# Patient Record
Sex: Female | Born: 1965 | Race: White | Hispanic: No | Marital: Married | Smoking: Never smoker
Health system: Southern US, Community
[De-identification: ages and names within clinical notes are randomized; demographics above are authoritative.]

---

## 2004-12-03 ENCOUNTER — Ambulatory Visit: Payer: Self-pay | Admitting: Otolaryngology

## 2005-07-12 ENCOUNTER — Ambulatory Visit: Payer: Self-pay | Admitting: Specialist

## 2005-08-06 ENCOUNTER — Ambulatory Visit: Payer: Self-pay | Admitting: Specialist

## 2008-04-02 ENCOUNTER — Ambulatory Visit: Payer: Self-pay | Admitting: Family Medicine

## 2010-03-11 ENCOUNTER — Ambulatory Visit: Payer: Self-pay | Admitting: Family Medicine

## 2012-07-04 IMAGING — CT CT STONE STUDY
1 of 2 series · 15 of 32 positions shown, 19 images · non-contrast
Comparison: none

REASON FOR EXAM: RT flank pain  hx of nephroliathasis  incomplete voiding
 hydronephrosis
COMMENTS:

PROCEDURE:     ISSAKA - ISSAKA ABDOMEN/PELVIS WO ( STONE)  - March 11, 2010  [DATE]
RESULT:     Comparison: 04/02/2008
TECHNIQUE: Multiple axial images from the lung bases to the symphysis pubis
were obtained without oral and without intravenous contrast.

[Series 3: soft tissue · axial · 0.71mm/px · z∈[-164,+278]mm · 15 of 161 slices shown, 19 images]
[im 7/161  soft-tissue]
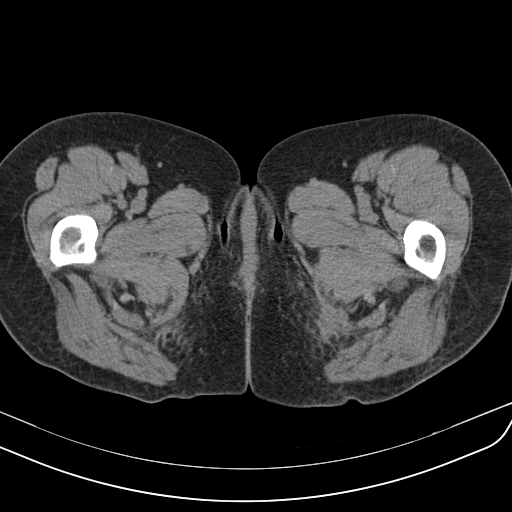
[im 7/161  bone]
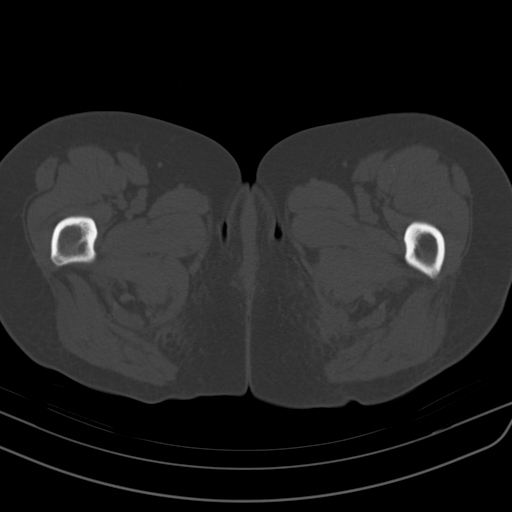
[im 20/161  soft-tissue]
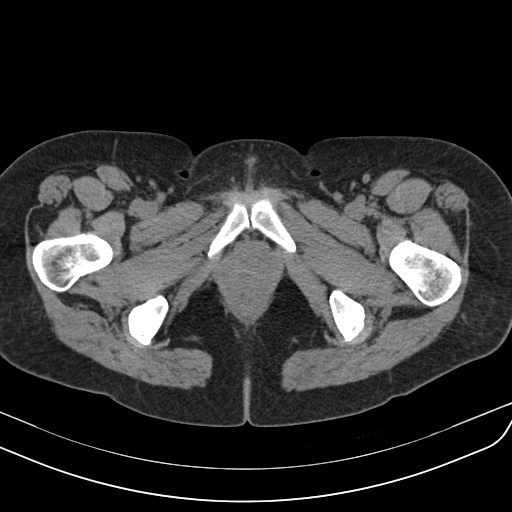
[im 33/161  soft-tissue]
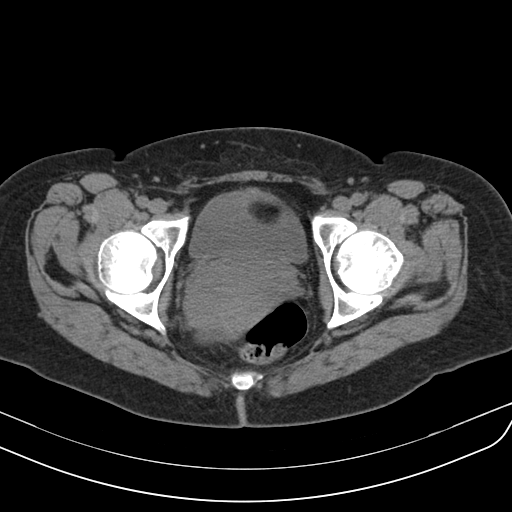
[im 45/161  soft-tissue]
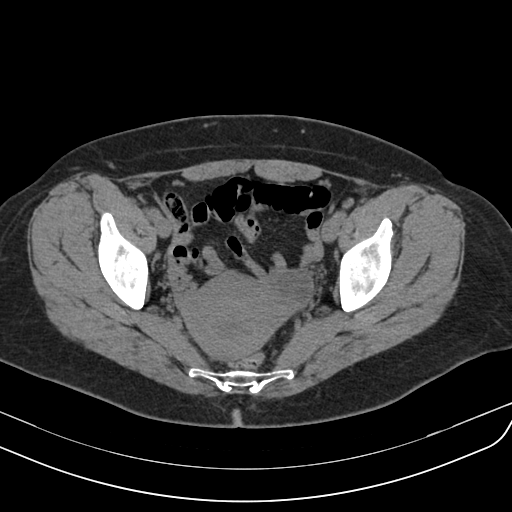
[im 58/161  soft-tissue]
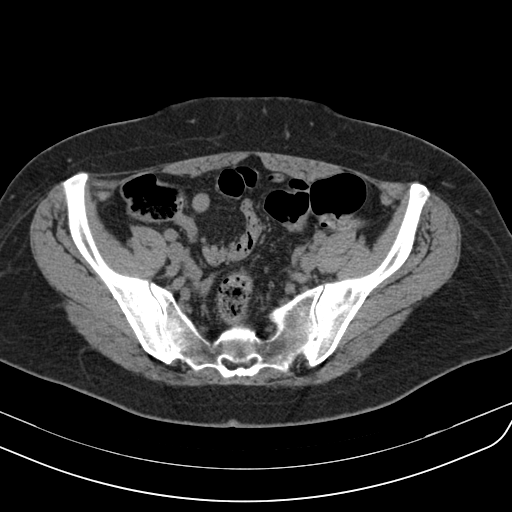
[im 71/161  soft-tissue]
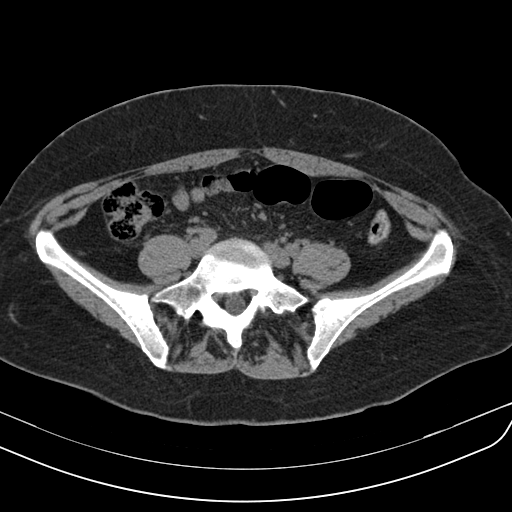
[im 84/161  soft-tissue]
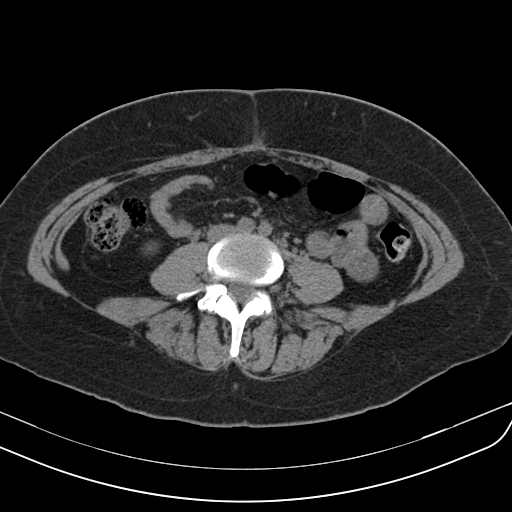
[im 90/161  soft-tissue]
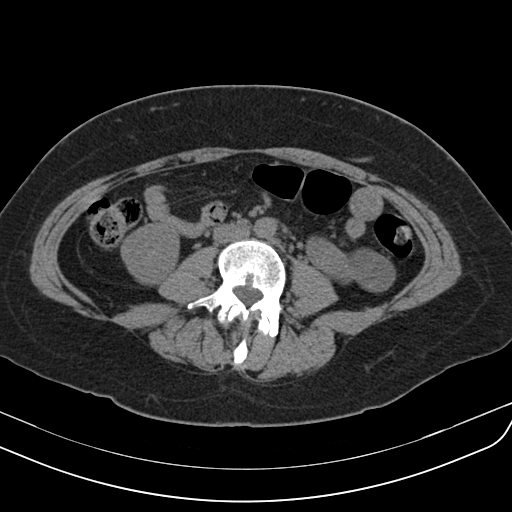
[im 103/161  soft-tissue]
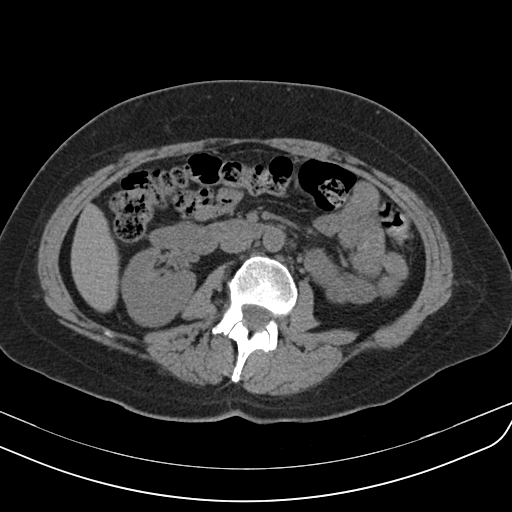
[im 103/161  bone]
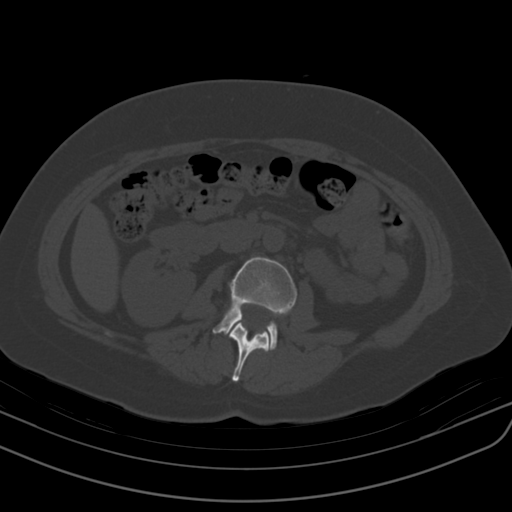
[im 116/161  soft-tissue]
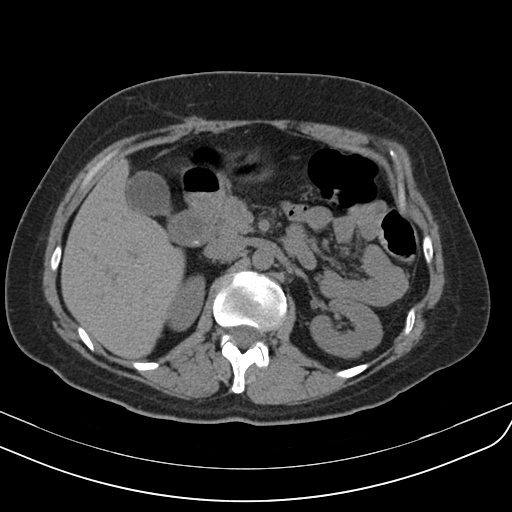
[im 129/161  soft-tissue]
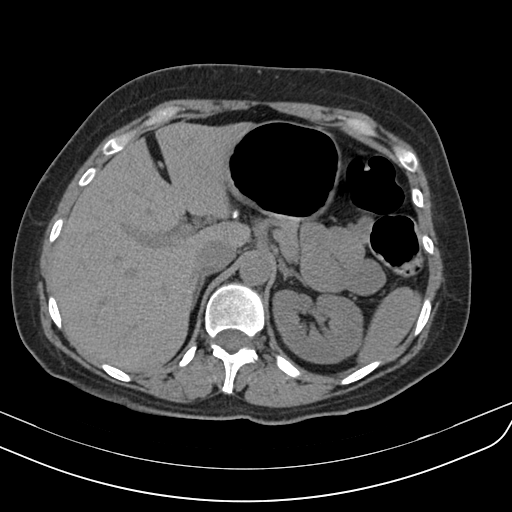
[im 135/161  lung]
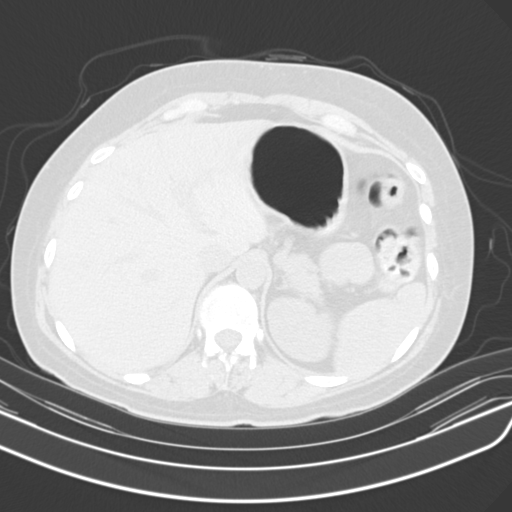
[im 141/161  soft-tissue]
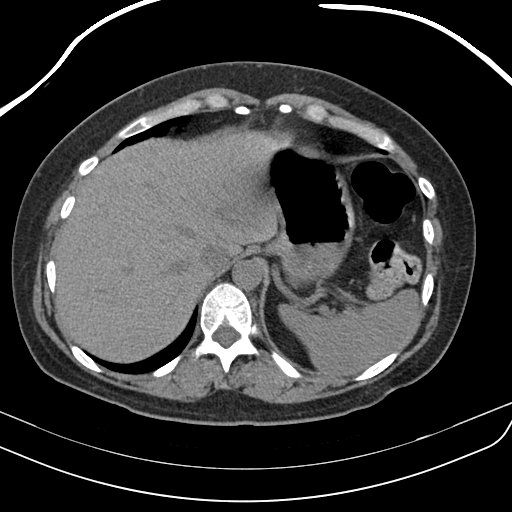
[im 141/161  lung]
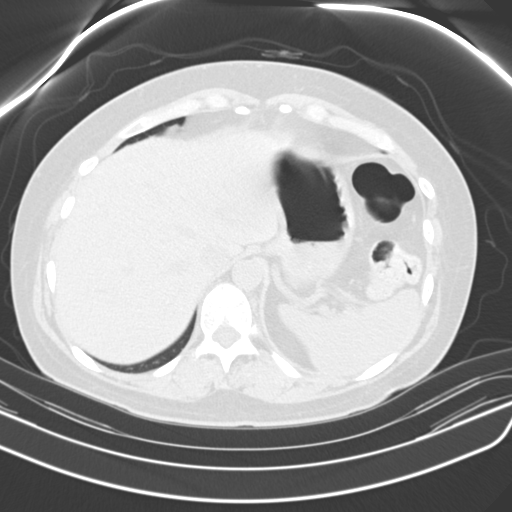
[im 148/161  lung]
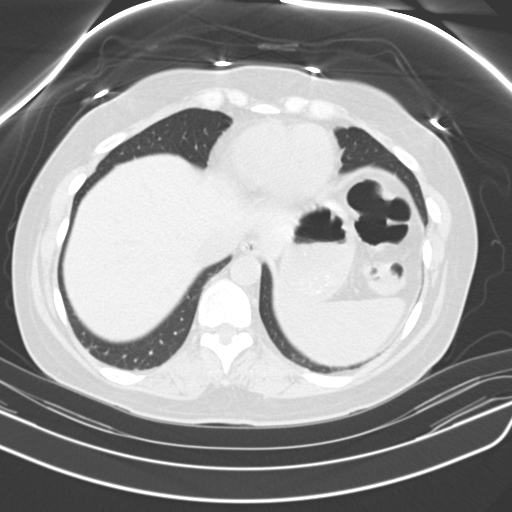
[im 154/161  soft-tissue]
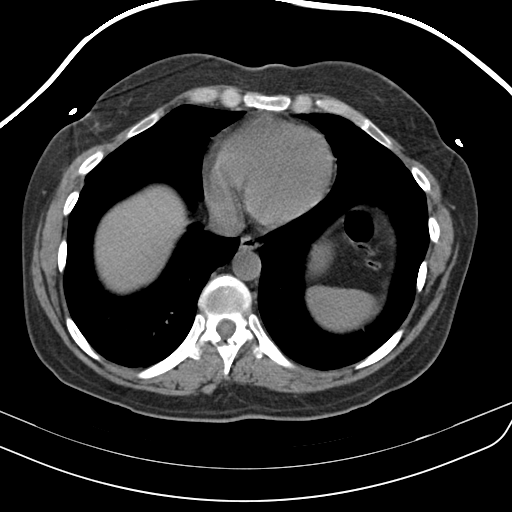
[im 154/161  lung]
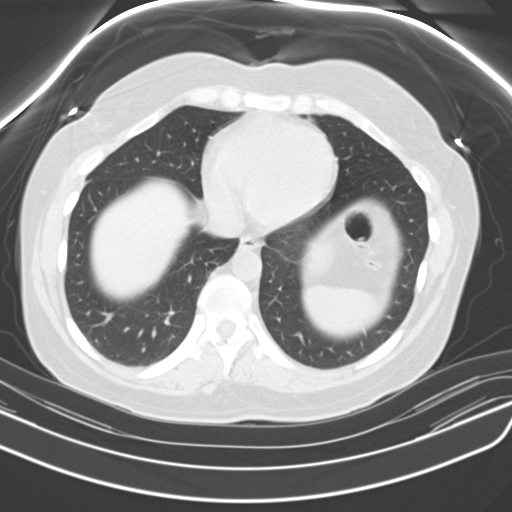

[15 of 32 positions shown; findings below may reference images not displayed]

FINDINGS: Lack of intravenous contrast limits evaluation of the solid abdominal
organs.  Grossly, the liver, gallbladder, spleen, adrenals, and pancreas are
unremarkable. Postsurgical changes seen along the stomach.

No renal calculi or hydronephrosis identified. No ureterectasis.

There is mild fluid in the endometrial canal. Correlate with the patient's
menstrual cycle. There is relative soft tissue prominence of the cervix.
There is a trace amount of free fluid in the pelvis, which is likely
physiologic. The appendix is normal.

No aggressive lytic or sclerotic osseous lesions are identified.
IMPRESSION: 1. No urinary calculi or hydronephrosis identified.
2. Soft tissue prominence of the cervix. Correlate with physical exam. Mild
fluid within the endometrial canal. Correlate with menstrual cycle.

## 2020-03-03 ENCOUNTER — Encounter: Payer: Self-pay | Admitting: Gastroenterology

## 2021-04-20 ENCOUNTER — Telehealth: Payer: Self-pay

## 2021-04-20 NOTE — Telephone Encounter (Signed)
[facility] called to inform us that she faxed over notes to our office. Still waiting on respiratory swab - once received she will fax over labs to our office.  ? ? ? ?Youssouf Shipley P Kain Milosevic, CMA ? ?

## 2021-04-21 ENCOUNTER — Other Ambulatory Visit
Admission: RE | Admit: 2021-04-21 | Discharge: 2021-04-21 | Disposition: A | Discharge status: Home / Self Care | Payer: BC Managed Care – PPO | Source: Ambulatory Visit | Attending: Infectious Diseases | Admitting: Infectious Diseases

## 2021-04-21 ENCOUNTER — Encounter: Payer: Self-pay | Admitting: Infectious Diseases

## 2021-04-21 ENCOUNTER — Ambulatory Visit: Payer: BC Managed Care – PPO | Attending: Infectious Diseases | Admitting: Infectious Diseases

## 2021-04-21 VITALS — BP 116/80 | HR 97 | Temp 98.2°F | Wt 161.0 lb

## 2021-04-21 DIAGNOSIS — E119 Type 2 diabetes mellitus without complications: Secondary | ICD-10-CM | POA: Diagnosis not present

## 2021-04-21 DIAGNOSIS — Z7984 Long term (current) use of oral hypoglycemic drugs: Secondary | ICD-10-CM | POA: Diagnosis not present

## 2021-04-21 DIAGNOSIS — J329 Chronic sinusitis, unspecified: Secondary | ICD-10-CM | POA: Insufficient documentation

## 2021-04-21 DIAGNOSIS — J320 Chronic maxillary sinusitis: Secondary | ICD-10-CM | POA: Diagnosis not present

## 2021-04-21 DIAGNOSIS — Z833 Family history of diabetes mellitus: Secondary | ICD-10-CM | POA: Insufficient documentation

## 2021-04-21 DIAGNOSIS — B9561 Methicillin susceptible Staphylococcus aureus infection as the cause of diseases classified elsewhere: Secondary | ICD-10-CM | POA: Diagnosis not present

## 2021-04-21 LAB — CBC WITH DIFFERENTIAL/PLATELET
Abs Immature Granulocytes: 0.02 10*3/uL (ref 0.00–0.07)
Basophils Absolute: 0 10*3/uL (ref 0.0–0.1)
Basophils Relative: 1 %
Eosinophils Absolute: 0 10*3/uL (ref 0.0–0.5)
Eosinophils Relative: 0 %
HCT: 37.5 % (ref 36.0–46.0)
Hemoglobin: 11.9 g/dL — ABNORMAL LOW (ref 12.0–15.0)
Immature Granulocytes: 0 %
Lymphocytes Relative: 31 %
Lymphs Abs: 1.6 10*3/uL (ref 0.7–4.0)
MCH: 30.8 pg (ref 26.0–34.0)
MCHC: 31.7 g/dL (ref 30.0–36.0)
MCV: 97.2 fL (ref 80.0–100.0)
Monocytes Absolute: 0.3 10*3/uL (ref 0.1–1.0)
Monocytes Relative: 6 %
Neutro Abs: 3.2 10*3/uL (ref 1.7–7.7)
Neutrophils Relative %: 62 %
Platelets: 362 10*3/uL (ref 150–400)
RBC: 3.86 MIL/uL — ABNORMAL LOW (ref 3.87–5.11)
RDW: 12.7 % (ref 11.5–15.5)
WBC: 5.2 10*3/uL (ref 4.0–10.5)
nRBC: 0 % (ref 0.0–0.2)

## 2021-04-21 LAB — COMPREHENSIVE METABOLIC PANEL
ALT: 15 U/L (ref 0–44)
AST: 26 U/L (ref 15–41)
Albumin: 4.2 g/dL (ref 3.5–5.0)
Alkaline Phosphatase: 71 U/L (ref 38–126)
Anion gap: 7 (ref 5–15)
BUN: 19 mg/dL (ref 6–20)
CO2: 28 mmol/L (ref 22–32)
Calcium: 9.2 mg/dL (ref 8.9–10.3)
Chloride: 106 mmol/L (ref 98–111)
Creatinine, Ser: 1.02 mg/dL — ABNORMAL HIGH (ref 0.44–1.00)
GFR, Estimated: >60 mL/min (ref >60)
Glucose, Bld: 111 mg/dL — ABNORMAL HIGH (ref 70–99)
Potassium: 3.4 mmol/L — ABNORMAL LOW (ref 3.5–5.1)
Sodium: 141 mmol/L (ref 135–145)
Total Bilirubin: 0.5 mg/dL (ref 0.3–1.2)
Total Protein: 8.1 g/dL (ref 6.5–8.1)

## 2021-04-21 LAB — SEDIMENTATION RATE: Sed Rate: 31 mm/hr — ABNORMAL HIGH (ref 0–30)

## 2021-04-21 LAB — HIV ANTIBODY (ROUTINE TESTING W REFLEX): HIV Screen 4th Generation wRfx: NONREACTIVE

## 2021-04-21 NOTE — Patient Instructions (Addendum)
You are ehre for MRSA recurrent sinusutins- will get the cultures from Dr.Juengel- today will do Immunoglobulin, ANCA, ESR, will have a plan once I talk with Dr.yuengel ?

## 2021-04-21 NOTE — Progress Notes (Signed)
NAME: Linda Haynes  ?DOB: [birth year]  ?MRN: [id number]  ?Date/Time: 04/21/2021 10:07 AM ? ?REQUESTING PROVIDER: Dr Kathyrn Sheriff ?Subjective:  ?REASON FOR CONSULT:staph  sinusitis ?? ?Linda Haynes is a 56 y.o. female with a history of recurrent rt sided maxillary sinusitis since 2007- was on IV vanco for 6 weeks in 2007 when she had MRSA and was doing better for many years after that but now with flare up since last year and has been on doxy Po and gent nasal washes since many months is referred to me for further management ?She was referred to me as MRSA sinusitis- apparently had MRSA in 2007 but the last culture from 12/02/2020 I see is MSSA and not MRSA . Another culture was sent by Dr.Juengel on 02/16/21 and that was MSSA as well sensitive to Dicloxacillin ?A third culture was sent last week and still pending ?I tried to reach Dr.Juengel while the patient was in my office and left a message ?Pt has no fever ?She has pain rt maxillary area ?She says she had osteo in the past ?No recent CT ?She does not give a hisotry of positive ANCA ?She has h/oasthma ?She has had pneumonia in the past but no diagnosis of immune deficiency ?She had a fall and November 2022 and sustained a T12 fracture injury.  She has been getting .  She is followed by Ortho. ?PMH ?Asthma ?HLD ?Thyroid disease ?Allergies ?GERD ? ?PSH ? ?Exp lap ?Cervical spine and lumbar spine surgery ?Tonillectomy ?Functional endoscopic surgeries 1991-2005 10 surgeries ? ?FH ?Mother DM, COPD, Thyroid disease ?Father DM, CAD, Emphysema and thyroid ? ?Social History  ? ?Socioeconomic History  ? Marital status: Married  ?  Spouse name: Not on file  ? Number of children: Not on file  ? Years of education: Not on file  ? Highest education level: Not on file  ?Occupational History  ? Not on file  ?Tobacco Use  ? Smoking status: Not on file  ? Smokeless tobacco: Not on file  ?Substance and Sexual Activity  ? Alcohol use: Not on file  ? Drug use: Not on file  ? Sexual  activity: Not on file  ?Other Topics Concern  ? Not on file  ?Social History Narrative  ? Not on file  ? ?Social Determinants of Health  ? ?Financial Resource Strain: Not on file  ?Food Insecurity: Not on file  ?Transportation Needs: Not on file  ?Physical Activity: Not on file  ?Stress: Not on file  ?Social Connections: Not on file  ?Intimate Partner Violence: Not on file  ?  ? ?Allergies  ?Allergen Reactions  ? Meperidine Other (See Comments)  ?  Paralyzes Patient   ?paralyzes ?paralyzes ?Paralyzes Patient   ?  ? Tramadol Other (See Comments)  ?  Other reaction(s): Seizures ?Seizures ?Other reaction(s): Seizures ?Seizures ?  ? Erythromycin Base Other (See Comments)  ? Zolpidem Tartrate Other (See Comments)  ?  Nocturnal eating syndrome ?Nocturnal eating syndrome  ? Eggs Or Egg-Derived Products   ? ?current meds ?Simvastatin ?Zetia ?Cymbalta ?Gabapentin ?Doxy ?Flexeril ?Lisinopril ?Ozempic ?Temazepam ?Ritalin ?trazodone ?Tymlos ?Synthroid ?metformin ?  ? ?REVIEW OF SYSTEMS:  ?Const: negative fever, negative chills, weight loss due to ozempic ?Eyes: negative diplopia or visual changes, negative eye pain ?ENT: greenish nasal discharge, post nasal drip, pain rt maxillary area ?Resp: negative cough, hemoptysis, dyspnea ?Cards: negative for chest pain, palpitations, lower extremity edema ?GU: negative for frequency, dysuria and hematuria ?GI: Negative for abdominal pain, diarrhea, bleeding, constipation ?  Skin: negative for rash and pruritus ?Heme: negative for easy bruising and gum/nose bleeding ?MS: back pain ?Neurolo:negative for headaches, dizziness, vertigo, memory problems  ?Psych: negative for feelings of anxiety, depression  ?Endocrine: r thyroid, diabetes issues ?Allergy/Immunology- as above: ?Objective:  ?VITALS:  ?BP 116/80   Pulse 97   Temp 98.2 ?F (36.8 ?C) (Oral)   Wt 161 lb (73 kg)  ?PHYSICAL EXAM:  ?General: Alert, cooperative, no distress, appears stated age.  ?Head: Normocephalic, without obvious  abnormality, atraumatic. ?Eyes: Conjunctivae clear, anicteric sclerae. Pupils are equal ?ENT Nares normal. No drainage or sinus tenderness. ?Lips, mucosa, and tongue normal. No Thrush ?Neck: Supple, symmetrical, no adenopathy, thyroid: non tender ?no carotid bruit and no JVD. ?Lungs: Clear to auscultation bilaterally. No Wheezing or Rhonchi. No rales. ?Heart: Regular rate and rhythm, no murmur, rub or gallop. ?Abdomen: did not examine ?Extremities: atraumatic, no cyanosis. No edema. No clubbing ?Skin: No rashes or lesions. Or bruising ?Lymph: Cervical, supraclavicular normal. ?Neurologic: Grossly non-focal ?Pertinent Labs ?none ?? 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 ?Told the patient that I will call her once I spoke with Dr.Y ? ?DM- on ozempic and metformin ? ?T12 fracture on Tymlos injection ? ? ? ? ?I could not reach him while she was in the clinic ?I spoke to him after she left the office ?Plan get CT scan ?If no osteo then can do oral antibiotic ?'if last culture is not MRSA and only MSSA can do dicloxacillin 52m PO 6 ? ?Called the patient and discussed the above plan with her  ?Will follow after CT scan ?? ?? ?___________________________________________________ ?Discussed with patient, requesting provider ?Note:  This document was prepared using Dragon voice recognition software and may include unintentional dictation errors.  ?

## 2021-04-23 ENCOUNTER — Other Ambulatory Visit: Payer: Self-pay | Admitting: Otolaryngology

## 2021-04-23 DIAGNOSIS — J320 Chronic maxillary sinusitis: Secondary | ICD-10-CM

## 2021-04-23 LAB — ANCA PROFILE
Anti-MPO Antibodies: <0.2 units (ref 0.0–0.9)
Anti-PR3 Antibodies: <0.2 units (ref 0.0–0.9)
Atypical P-ANCA titer: <1:20 {titer}
C-ANCA: <1:20 {titer}
P-ANCA: <1:20 {titer}

## 2021-04-23 LAB — IMMUNOGLOBULINS A/E/G/M, SERUM
IgA: 53 mg/dL — ABNORMAL LOW (ref 87–352)
IgE (Immunoglobulin E), Serum: 6 IU/mL (ref 6–495)
IgG (Immunoglobin G), Serum: 1070 mg/dL (ref 586–1602)
IgM (Immunoglobulin M), Srm: 78 mg/dL (ref 26–217)

## 2021-04-24 ENCOUNTER — Ambulatory Visit
Admission: RE | Admit: 2021-04-24 | Discharge: 2021-04-24 | Disposition: A | Discharge status: Home / Self Care | Payer: BC Managed Care – PPO | Source: Ambulatory Visit | Attending: Otolaryngology | Admitting: Otolaryngology

## 2021-04-24 ENCOUNTER — Other Ambulatory Visit: Payer: Self-pay | Admitting: Infectious Diseases

## 2021-04-24 DIAGNOSIS — J329 Chronic sinusitis, unspecified: Secondary | ICD-10-CM

## 2021-04-24 DIAGNOSIS — J320 Chronic maxillary sinusitis: Secondary | ICD-10-CM | POA: Insufficient documentation

## 2021-04-24 MED ORDER — DICLOXACILLIN SODIUM 500 MG PO CAPS: 500.0000 mg | ORAL_CAPSULE | Freq: Four times a day (QID) | ORAL | 1 refills | Status: DC

## 2021-04-24 NOTE — Progress Notes (Signed)
Reviewed CT scan, immune globilin levels ( IgA low at 57) ?Discussed with Dr.Yuengel and reviewed films with him- spoke to patient ?Plan for MSSa chronic sinusitis ?Dc doxy ?Dicloxacillin 500mg  PO Q 6 ?Repeat IgA ?If no improvement to PO diclox will then do IV  ?Will talk to patient next week after a few days of PO to see how she is doing ? ?

## 2021-04-27 ENCOUNTER — Telehealth: Payer: Self-pay

## 2021-04-27 NOTE — Telephone Encounter (Signed)
Patient called and left a voicemail on 04/24/21 requesting her CT results. Patient's stat CT was ordered by Dr. Charolotte Eke office. I followed up with the patient today and advised the patient she should reach out to his office to have them go over the results with her.  ?Linda Haynes ? ?

## 2021-04-28 ENCOUNTER — Telehealth: Payer: Self-pay | Admitting: Infectious Diseases

## 2021-04-28 ENCOUNTER — Telehealth: Payer: Self-pay

## 2021-04-28 ENCOUNTER — Other Ambulatory Visit: Payer: Self-pay

## 2021-04-28 DIAGNOSIS — J320 Chronic maxillary sinusitis: Secondary | ICD-10-CM

## 2021-04-28 DIAGNOSIS — B9561 Methicillin susceptible Staphylococcus aureus infection as the cause of diseases classified elsewhere: Secondary | ICD-10-CM

## 2021-04-28 NOTE — Telephone Encounter (Signed)
PT called and stated she was still experiencing pain. She also would like to discuss the antibiotic treatments she was previously prescribed. ?

## 2021-04-28 NOTE — Treatment Plan (Signed)
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 ?Culture Result: MSSA ? ?Allergies  ?Allergen Reactions  ? Meperidine Other (See Comments)  ?  Paralyzes Patient   ?paralyzes ?paralyzes ?Paralyzes Patient   ?  ? Tramadol Other (See Comments)  ?  Other reaction(s): Seizures ?Seizures ?Other reaction(s): Seizures ?Seizures ?  ? Erythromycin Base Other (See Comments)  ? Zolpidem Tartrate Other (See Comments)  ?  Nocturnal eating syndrome ?Nocturnal eating syndrome  ? Eggs Or Egg-Derived Products   ? ? ?OPAT Orders ?Discharge antibiotics: ?Cefazolin 2 grams IV every 8 hours until 06/03/21 ?For 5 weeks ?[facility] Per Protocol: ? ?Labs weekly while on IV antibiotics: ?_X_ CBC with differential ?_X_CMP ?_XX_ ESR ? ? ?_X_ Please pull PIC at completion of IV antibiotics ? ? ?Fax weekly labs to [phone] ? ?Clinic Follow Up Appt: 05/19/21 at 10.15 am ? ? ?Call [phone] any questions ? ?  ?

## 2021-04-28 NOTE — Telephone Encounter (Signed)
PT called and stated she was still experiencing pain and wanted to discuss the antibiotic treatments she was previously prescribed ?

## 2021-04-28 NOTE — Telephone Encounter (Signed)
Per Dr Rivka Safer patient will be scheduled for Picc placement at [facility]-and will need  cefazolin 2 grams IV every 8 hours. ? ?I have contacted Same Day at [facility] and spoke to Darrin Luis who will schedule patient with Same Day/IR for PICC placement. ? ?FIRST DOSE will be given at Same Day upon placement of PICC. ? ?End Date: 4-6 weeks after start date.  ? ?I have attached [facility], [facility] and forwarded community message to [facility] team to set up Home Health Plans.  ?

## 2021-05-04 ENCOUNTER — Ambulatory Visit
Admission: RE | Admit: 2021-05-04 | Discharge: 2021-05-04 | Disposition: A | Discharge status: Home / Self Care | Payer: BC Managed Care – PPO | Source: Ambulatory Visit | Attending: Infectious Diseases | Admitting: Infectious Diseases

## 2021-05-04 ENCOUNTER — Ambulatory Visit
Admission: RE | Admit: 2021-05-04 | Discharge: 2021-05-04 | Disposition: A | Discharge status: Home / Self Care | Payer: Self-pay | Source: Ambulatory Visit | Attending: Infectious Diseases | Admitting: Infectious Diseases

## 2021-05-04 DIAGNOSIS — J320 Chronic maxillary sinusitis: Secondary | ICD-10-CM | POA: Diagnosis present

## 2021-05-04 DIAGNOSIS — J329 Chronic sinusitis, unspecified: Secondary | ICD-10-CM

## 2021-05-04 DIAGNOSIS — R7881 Bacteremia: Secondary | ICD-10-CM

## 2021-05-04 MED ORDER — SODIUM CHLORIDE 0.9% FLUSH: 10.0000 mL | Freq: Two times a day (BID) | INTRAVENOUS | Status: DC

## 2021-05-04 MED ORDER — HEPARIN SOD (PORK) LOCK FLUSH 100 UNIT/ML IV SOLN: 500.0000 [IU] | Freq: Once | INTRAVENOUS | Status: AC

## 2021-05-04 MED ORDER — CEFAZOLIN SODIUM-DEXTROSE 2-4 GM/100ML-% IV SOLN: 2.0000 g | Freq: Once | INTRAVENOUS | Status: AC

## 2021-05-04 MED ORDER — SODIUM CHLORIDE 0.9% FLUSH: 10.0000 mL | INTRAVENOUS | Status: DC | PRN

## 2021-05-04 MED ORDER — SODIUM CHLORIDE FLUSH 0.9 % IV SOLN
INTRAVENOUS | Status: AC
  Administered 2021-05-04: 10 mL
  Filled 2021-05-04: qty 20

## 2021-05-04 MED ORDER — CEFAZOLIN SODIUM-DEXTROSE 2-4 GM/100ML-% IV SOLN
INTRAVENOUS | Status: AC
  Administered 2021-05-04: 2 g via INTRAVENOUS
  Filled 2021-05-04: qty 100

## 2021-05-04 MED ORDER — HEPARIN SOD (PORK) LOCK FLUSH 100 UNIT/ML IV SOLN
INTRAVENOUS | Status: AC
  Administered 2021-05-04: 500 [IU] via INTRAVENOUS
  Filled 2021-05-04: qty 5

## 2021-05-04 MED ORDER — CHLORHEXIDINE GLUCONATE CLOTH 2 % EX PADS: 6.0000 | MEDICATED_PAD | Freq: Every day | CUTANEOUS | Status: DC

## 2021-05-04 NOTE — Progress Notes (Signed)
Peripherally Inserted Central Catheter Placement ? ?The IV Nurse has discussed with the patient and/or persons authorized to consent for the patient, the purpose of this procedure and the potential benefits and risks involved with this procedure.  The benefits include less needle sticks, lab draws from the catheter, and the patient may be discharged home with the catheter. Risks include, but not limited to, infection, bleeding, blood clot (thrombus formation), and puncture of an artery; nerve damage and irregular heartbeat and possibility to perform a PICC exchange if needed/ordered by physician.  Alternatives to this procedure were also discussed.  Bard Power PICC patient education guide, fact sheet on infection prevention and patient information card has been provided to patient /or left at bedside.   ? ?PICC Placement Documentation  ?PICC Single Lumen [id number] Right Basilic 40 cm 0 cm (Active)  ?Indication for Insertion or Continuance of Line Home intravenous therapies (PICC only) 05/04/21 1541  ?Exposed Catheter (cm) 0 cm 05/04/21 1541  ?Site Assessment Clean, Dry, Intact 05/04/21 1541  ?Line Status Flushed;Saline locked;Blood return noted 05/04/21 1541  ?Dressing Type Securing device;Transparent 05/04/21 1541  ?Dressing Status Antimicrobial disc in place 05/04/21 1541  ?Dressing Intervention New dressing;Other (Comment) 05/04/21 1541  ?Dressing Change Due 05/11/21 05/04/21 1541  ? ? ? ? ? ?Christella Noa Albarece ?05/04/2021, 3:42 PM ? ?

## 2021-05-05 ENCOUNTER — Other Ambulatory Visit: Payer: BC Managed Care – PPO

## 2021-05-05 LAB — IGA: IgA: 52 mg/dL — ABNORMAL LOW (ref 87–352)

## 2021-05-08 ENCOUNTER — Encounter: Payer: Self-pay | Admitting: Infectious Diseases

## 2021-05-12 ENCOUNTER — Ambulatory Visit: Payer: BC Managed Care – PPO | Attending: Infectious Diseases | Admitting: Infectious Diseases

## 2021-05-12 ENCOUNTER — Encounter: Payer: Self-pay | Admitting: Infectious Diseases

## 2021-05-12 DIAGNOSIS — Z792 Long term (current) use of antibiotics: Secondary | ICD-10-CM | POA: Diagnosis not present

## 2021-05-12 DIAGNOSIS — R519 Headache, unspecified: Secondary | ICD-10-CM | POA: Diagnosis not present

## 2021-05-12 DIAGNOSIS — Z79899 Other long term (current) drug therapy: Secondary | ICD-10-CM | POA: Diagnosis not present

## 2021-05-12 DIAGNOSIS — E039 Hypothyroidism, unspecified: Secondary | ICD-10-CM | POA: Insufficient documentation

## 2021-05-12 DIAGNOSIS — D802 Selective deficiency of immunoglobulin A [IgA]: Secondary | ICD-10-CM | POA: Diagnosis present

## 2021-05-12 DIAGNOSIS — J329 Chronic sinusitis, unspecified: Secondary | ICD-10-CM | POA: Insufficient documentation

## 2021-05-12 NOTE — Progress Notes (Signed)
The purpose of this virtual visit is to provide medical care while limiting exposure to the novel coronavirus (COVID19) for both patient and office staff. ?  ?Consent was obtained for phone visit:  Yes.   ?Answered questions that patient had about telehealth interaction:  Yes.   ?I discussed the limitations, risks, security and privacy concerns of performing an evaluation and management service by telephone. I also discussed with the patient that there may be a patient responsible charge related to this service. The patient expressed understanding and agreed to proceed. ?  ?Patient Location: Home ?Provider Location: office ?Follow up after starting iV cefazolin for chronic sinusitis with MSSA and also to discuss recent IgA result ?Pt has recurrent/lingering sinusitis ?She has headache over the sinuses which is better since starting iV cefazolin- she says the congestion is better. 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 Will refer her to [facility] for management of IgA deficiency ?Total time spent 11 min ?

## 2021-05-19 ENCOUNTER — Encounter: Payer: Self-pay | Admitting: Infectious Diseases

## 2021-05-19 ENCOUNTER — Ambulatory Visit: Payer: BC Managed Care – PPO | Attending: Infectious Diseases | Admitting: Infectious Diseases

## 2021-05-19 VITALS — BP 90/59 | HR 90 | Temp 96.4°F | Ht 69.0 in | Wt 161.0 lb

## 2021-05-19 DIAGNOSIS — E119 Type 2 diabetes mellitus without complications: Secondary | ICD-10-CM | POA: Diagnosis not present

## 2021-05-19 DIAGNOSIS — Z043 Encounter for examination and observation following other accident: Secondary | ICD-10-CM | POA: Insufficient documentation

## 2021-05-19 DIAGNOSIS — B9561 Methicillin susceptible Staphylococcus aureus infection as the cause of diseases classified elsewhere: Secondary | ICD-10-CM | POA: Insufficient documentation

## 2021-05-19 DIAGNOSIS — J320 Chronic maxillary sinusitis: Secondary | ICD-10-CM | POA: Insufficient documentation

## 2021-05-19 NOTE — Progress Notes (Signed)
NAME: Linda Haynes  ?DOB: 1965/06/08  ?MRN: [id number]  ?Date/Time: 05/19/2021 10:21 AM ? ?REQUESTING PROVIDER: Dr Kathyrn Sheriff ?Subjective:  ?REASON FOR CONSULT:staph  sinusitis ?Patient is here for follow-up.  She has recurrent chronic  maxillary sinusitis due to Staph aureus.  She is currently on IV cefazolin which she will be getting until May 17/23 when she will finish 6 weeks treatment ?She is tolerating the medication well.  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  Repeat was done it was  low as well ? ? ?The following is taken from the notes from before ?Linda Haynes is /a 56 y.o. female with a history of recurrent rt sided maxillary sinusitis since 2007- was on IV vanco for 6 weeks in 2007 when she had MRSA and was doing better for many years after that but now with flare up since last year and has been on doxy Po and gent nasal washes since many months is referred to me for further management ?She was referred to me as MRSA sinusitis- apparently had MRSA in 2007 but the last culture from 12/02/2020 I see is MSSA and not MRSA . Another culture was sent by Dr.Juengel on 02/16/21 and that was MSSA as well sensitive to Dicloxacillin ?A third culture was sent last week and still pending ?I tried to reach Dr.Juengel while the patient was in my office and left a message ?Pt has no fever ?She has pain rt maxillary area ?She says she had osteo in the past ?No recent CT ?She does not give a hisotry of positive ANCA ?She has h/oasthma ?She has had pneumonia in the past but no diagnosis of immune deficiency ?She had a fall and November 2022 and sustained a T12 fracture injury.  She has been getting .  She is followed by Ortho. ? ? ?PMH ?Asthma ?HLD ?Thyroid disease ?Allergies ?GERD ? ?PSH ? ?Exp lap ?Cervical spine and lumbar spine surgery ?Tonillectomy ?Functional endoscopic surgeries 1991-2005 10 surgeries ? ?FH ?Mother DM, COPD, Thyroid disease ?Father  DM, CAD, Emphysema and thyroid ? ?Social History  ? ?Socioeconomic History  ? Marital status: Married  ?  Spouse name: Not on file  ? Number of children: Not on file  ? Years of education: Not on file  ? Highest education level: Not on file  ?Occupational History  ? Not on file  ?Tobacco Use  ? Smoking status: Never  ? Smokeless tobacco: Never  ?Substance and Sexual Activity  ? Alcohol use: Not Currently  ? Drug use: Never  ? Sexual activity: Not on file  ?Other Topics Concern  ? Not on file  ?Social History Narrative  ? Not on file  ? ?Social Determinants of Health  ? ?Financial Resource Strain: Not on file  ?Food Insecurity: Not on file  ?Transportation Needs: Not on file  ?Physical Activity: Not on file  ?Stress: Not on file  ?Social Connections: Not on file  ?Intimate Partner Violence: Not on file  ?  ? ?Allergies  ?Allergen Reactions  ? Meperidine Other (See Comments)  ?  Paralyzes Patient   ?paralyzes ?paralyzes ?Paralyzes Patient   ?  ? Tramadol Other (See Comments)  ?  Other reaction(s): Seizures ?Seizures ?Other reaction(s): Seizures ?Seizures ?  ? Erythromycin Base Other (See Comments)  ? Zolpidem Tartrate Other (See Comments)  ?  Nocturnal eating syndrome ?Nocturnal eating syndrome  ? Eggs Or Egg-Derived Products   ? ?current meds ?  Simvastatin ?Zetia ?Cymbalta ?Gabapentin ?Doxy ?Flexeril ?Lisinopril ?Ozempic ?Temazepam ?Ritalin ?trazodone ?Tymlos ?Synthroid ?metformin ?  ? ?REVIEW OF SYSTEMS:  ?Const: negative fever, negative chills, weight loss due to ozempic ?Eyes: negative diplopia or visual changes, negative eye pain ?ENT: greenish nasal discharge, post nasal drip, pain rt maxillary area all much improved ?Resp: negative cough, hemoptysis, dyspnea ?Cards: negative for chest pain, palpitations, lower extremity edema ?GU: negative for frequency, dysuria and hematuria ?GI: Negative for abdominal pain, diarrhea, bleeding, constipation ?Skin: negative for rash and pruritus ?Heme: negative for easy bruising  and gum/nose bleeding ?MS: back pain ?Neurolo:negative for headaches, dizziness, vertigo, memory problems  ?Psych: negative for feelings of anxiety, depression  ?Endocrine:  thyroid, diabetes issues ?Allergy/Immunology- as above: ?Objective:  ?VITALS:  ?BP (!) 90/59   Pulse 90   Temp (!) 96.4 ?F (35.8 ?C) (Temporal)   Ht 5' 9"  (1.753 m)   Wt 161 lb (73 kg)   BMI 23.78 kg/m?  ?PHYSICAL EXAM:  ?General: Alert, cooperative, no distress,  ?Head: Normocephalic, without obvious abnormality, atraumatic. ?Eyes: Conjunctivae clear, anicteric sclerae. Pupils are equal ?ENT Nares normal. No drainage or sinus tenderness. ?Lips, mucosa, and tongue normal. No Thrush ?Neck: Supple, symmetrical, no adenopathy, thyroid: non tender ?no carotid bruit and no JVD. ?Lungs: Clear to auscultation bilaterally. No Wheezing or Rhonchi. No rales. ?Heart: Regular rate and rhythm, no murmur, rub or gallop. ?Abdomen: did not examine ?Extremities: atraumatic, no cyanosis. No edema. No clubbing ?Skin: No rashes or lesions. Or bruising ?Lymph: Cervical, supraclavicular normal. ?Neurologic: Grossly non-focal ?Pertinent Labs ?none ?? ?Impression/Recommendation ??chronic maxillary sinusitis ?Now has MSSA in the cultures taken  Nov 2022 and jan , 2023) and March 2023. ? ?She is currently on IV cefazolin.  She will complete 6 weeks of antibiotics on 06/03/2021 ?She has low IgA levels.  Is around 60.  Other immunoglobulin levels were normal. ?We will refer her to Richardean Sale at [facility] allergy immunology.  For further management. ?ANCA, ESR, CRP and HIV were all normal. ? ? ?DM- on ozempic and metformin ? ?T12 fracture on Tymlos injection ?Discussed the management with the patient in detail.  We will follow her as needed. ? ?__Patient referred to [facility]  ?Note:  This document was prepared using Dragon voice recognition software and may include unintentional dictation errors.  ?  ?

## 2021-05-19 NOTE — Patient Instructions (Signed)
You are here for follow up for MSSA maxillary sinusitis- you are on cefazolin and will complete on 06/03/21- I have faxed the request for appt with Dr.Mildred kwan -allergy Immunology at [facility] for IgA deficiency ?

## 2021-05-29 ENCOUNTER — Encounter: Payer: Self-pay | Admitting: Infectious Diseases

## 2021-05-29 NOTE — Telephone Encounter (Signed)
I spoke to [name] with [facility] in [name] [phone]) and gave verbal orders for patient's IV antibiotics to be extended to 06/10/21. [name] verbalized understanding. ?Linda Haynes ? ?

## 2021-06-09 ENCOUNTER — Other Ambulatory Visit: Payer: Self-pay | Admitting: Infectious Diseases

## 2021-06-09 DIAGNOSIS — J320 Chronic maxillary sinusitis: Secondary | ICD-10-CM

## 2021-06-09 MED ORDER — DICLOXACILLIN SODIUM 500 MG PO CAPS: 1000.0000 mg | ORAL_CAPSULE | Freq: Four times a day (QID) | ORAL | 1 refills | Status: DC

## 2021-06-09 NOTE — Progress Notes (Signed)
Called patient- she is completing 6 weeks of IV cefazolin tomorrow for chronic osteo of sinus-MSSA in culture  I am giving her diclox 1 gram Q 6 for 6 weeks - if she cannot tolerate high dise she will educe it to 500mg   Q 6 She saw immunologist Dr.Kwan who has referred her to ENT for a 2nd opinion. She alos cjecked her antibodies for tetanus vaccine, diptheria- She asked her to get the new Prevnar 20 and she will see her back in 9 weeks Made nan appt for 06/25/21 at 8.30 am for follow up, labs and prevnar 20

## 2021-06-25 ENCOUNTER — Ambulatory Visit: Payer: BC Managed Care – PPO | Admitting: Infectious Diseases

## 2021-07-02 ENCOUNTER — Encounter: Payer: Self-pay | Admitting: Infectious Diseases

## 2021-07-02 ENCOUNTER — Ambulatory Visit: Payer: BC Managed Care – PPO | Attending: Infectious Diseases | Admitting: Infectious Diseases

## 2021-07-02 ENCOUNTER — Other Ambulatory Visit
Admission: RE | Admit: 2021-07-02 | Discharge: 2021-07-02 | Disposition: A | Discharge status: Home / Self Care | Payer: BC Managed Care – PPO | Source: Ambulatory Visit | Attending: Infectious Diseases | Admitting: Infectious Diseases

## 2021-07-02 VITALS — BP 121/75 | HR 98 | Temp 97.3°F | Ht 70.0 in | Wt 152.0 lb

## 2021-07-02 DIAGNOSIS — Z794 Long term (current) use of insulin: Secondary | ICD-10-CM | POA: Insufficient documentation

## 2021-07-02 DIAGNOSIS — E119 Type 2 diabetes mellitus without complications: Secondary | ICD-10-CM | POA: Insufficient documentation

## 2021-07-02 DIAGNOSIS — Z23 Encounter for immunization: Secondary | ICD-10-CM | POA: Diagnosis not present

## 2021-07-02 DIAGNOSIS — D802 Selective deficiency of immunoglobulin A [IgA]: Secondary | ICD-10-CM | POA: Insufficient documentation

## 2021-07-02 DIAGNOSIS — J320 Chronic maxillary sinusitis: Secondary | ICD-10-CM | POA: Insufficient documentation

## 2021-07-02 DIAGNOSIS — Z22321 Carrier or suspected carrier of Methicillin susceptible Staphylococcus aureus: Secondary | ICD-10-CM | POA: Insufficient documentation

## 2021-07-02 DIAGNOSIS — Z7985 Long-term (current) use of injectable non-insulin antidiabetic drugs: Secondary | ICD-10-CM | POA: Diagnosis not present

## 2021-07-02 DIAGNOSIS — Z79899 Other long term (current) drug therapy: Secondary | ICD-10-CM | POA: Diagnosis not present

## 2021-07-02 LAB — CBC WITH DIFFERENTIAL/PLATELET
Abs Immature Granulocytes: 0.03 10*3/uL (ref 0.00–0.07)
Basophils Absolute: 0 10*3/uL (ref 0.0–0.1)
Basophils Relative: 1 %
Eosinophils Absolute: 0 10*3/uL (ref 0.0–0.5)
Eosinophils Relative: 0 %
HCT: 37 % (ref 36.0–46.0)
Hemoglobin: 12.2 g/dL (ref 12.0–15.0)
Immature Granulocytes: 1 %
Lymphocytes Relative: 41 %
Lymphs Abs: 2 10*3/uL (ref 0.7–4.0)
MCH: 32.3 pg (ref 26.0–34.0)
MCHC: 33 g/dL (ref 30.0–36.0)
MCV: 97.9 fL (ref 80.0–100.0)
Monocytes Absolute: 0.4 10*3/uL (ref 0.1–1.0)
Monocytes Relative: 8 %
Neutro Abs: 2.4 10*3/uL (ref 1.7–7.7)
Neutrophils Relative %: 49 %
Platelets: 327 10*3/uL (ref 150–400)
RBC: 3.78 MIL/uL — ABNORMAL LOW (ref 3.87–5.11)
RDW: 12.5 % (ref 11.5–15.5)
WBC: 4.8 10*3/uL (ref 4.0–10.5)
nRBC: 0 % (ref 0.0–0.2)

## 2021-07-02 LAB — COMPREHENSIVE METABOLIC PANEL
ALT: 28 U/L (ref 0–44)
AST: 33 U/L (ref 15–41)
Albumin: 4.2 g/dL (ref 3.5–5.0)
Alkaline Phosphatase: 58 U/L (ref 38–126)
Anion gap: 7 (ref 5–15)
BUN: 11 mg/dL (ref 6–20)
CO2: 27 mmol/L (ref 22–32)
Calcium: 9.8 mg/dL (ref 8.9–10.3)
Chloride: 107 mmol/L (ref 98–111)
Creatinine, Ser: 0.66 mg/dL (ref 0.44–1.00)
GFR, Estimated: >60 mL/min (ref >60)
Glucose, Bld: 126 mg/dL — ABNORMAL HIGH (ref 70–99)
Potassium: 4.7 mmol/L (ref 3.5–5.1)
Sodium: 141 mmol/L (ref 135–145)
Total Bilirubin: 0.5 mg/dL (ref 0.3–1.2)
Total Protein: 7.3 g/dL (ref 6.5–8.1)

## 2021-07-02 LAB — SEDIMENTATION RATE: Sed Rate: 11 mm/hr (ref 0–30)

## 2021-07-02 LAB — C-REACTIVE PROTEIN: CRP: 0.6 mg/dL (ref <1.0)

## 2021-07-02 NOTE — Progress Notes (Signed)
NAME: Linda Haynes  DOB: 1965/03/10  MRN: [id number]  Date/Time: 07/02/2021 8:42 AM   Patient is here for follow-up.  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  She completed nearly 6 weeks of IV cefazolin and is currently on p.o. Keflex.  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  Repeat was done it was  low as well.  She was referred to Dr. Glean Salen at [facility] for the low immunoglobulin.  She did not think it was necessary to give IV Ig She is monitoring her response to vaccines She has referred her to ENT surgeon at [facility] for another opinion Patient is doing well Tolerating Keflex 1 g every 6 but she takes it 500 mg every 2 hours.  From today she can be switched to  500 mg every 6 and stop 1 g. No diarrhea No fever Pain in the left maxillary sinus is much improved Minimal postnasal drip Clear discharge She has appointment with the ENT surgeon in July   PMH Asthma HLD Thyroid disease Allergies GERD T 12  fracture  PSH  Exp lap Cervical spine and lumbar spine surgery Tonillectomy Functional endoscopic surgeries 1991-2005 10 surgeries  FH Mother DM, COPD, Thyroid disease Father DM, CAD, Emphysema and thyroid  Social History   Socioeconomic History   Marital status: Married    Spouse name: Not on file   Number of children: Not on file   Years of education: Not on file   Highest education level: Not on file  Occupational History   Not on file  Tobacco Use   Smoking status: Never   Smokeless tobacco: Never  Substance and Sexual Activity   Alcohol use: Not Currently   Drug use: Never   Sexual activity: Not on file  Other Topics Concern   Not on file  Social History Narrative   Not on file   Social Determinants of Health   Financial Resource Strain: Not on file  Food Insecurity: Not on file  Transportation Needs: Not on file  Physical Activity: Not on file  Stress: Not on file  Social  Connections: Not on file  Intimate Partner Violence: Not on file     Allergies  Allergen Reactions   Meperidine Other (See Comments)    Paralyzes Patient   paralyzes paralyzes Paralyzes Patient      Tramadol Other (See Comments)    Other reaction(s): Seizures Seizures Other reaction(s): Seizures Seizures    Erythromycin Base Other (See Comments)   Zolpidem Tartrate Other (See Comments)    Nocturnal eating syndrome Nocturnal eating syndrome   Eggs Or Egg-Derived Products    current meds Simvastatin Zetia Cymbalta Gabapentin Flexeril Lisinopril Ozempic Temazepam Ritalin trazodone Tymlos- for osteo Synthroid Metformin B12 injections     REVIEW OF SYSTEMS:  Const: negative fever, negative chills, weight loss due to ozempic Eyes: negative diplopia or visual changes, negative eye pain ENT: Clear minute nasal discharge, well t nasal drip, pain rt maxillary area all most resolved Resp: negative cough, hemoptysis, dyspnea Cards: negative for chest pain, palpitations, lower extremity edema GU: negative for frequency, dysuria and hematuria GI: Negative for abdominal pain, diarrhea, bleeding, constipation Skin: negative for rash and pruritus Heme: negative for easy bruising and gum/nose bleeding MS: back pain Neurolo:negative for headaches, dizziness, vertigo, memory problems  Psych: negative for feelings of anxiety, depression  Endocrine:   thyroid, diabetes issues Allergy/Immunology- as above: Objective:  VITALS:  BP 121/75  Pulse 98   Temp (!) 97.3 F (36.3 C) (Temporal)   Ht _0  (1.778 m)   Wt 152 lb (68.9 kg)   BMI 21.81 kg/m  PHYSICAL EXAM:  General: Alert, cooperative, no distress,  Head: Normocephalic, without obvious abnormality, atraumatic. Eyes: Conjunctivae clear, anicteric sclerae. Pupils are equal ENT Nares normal. No drainage or sinus tenderness. Lips, mucosa, and tongue normal. No Thrush Neck: Supple, symmetrical, no adenopathy,  thyroid: non tender no carotid bruit and no JVD. Lungs: Clear to auscultation bilaterally. No Wheezing or Rhonchi. No rales. Heart: Regular rate and rhythm, no murmur, rub or gallop. Abdomen: did not examine Extremities: atraumatic, no cyanosis. No edema. No clubbing Skin: No rashes or lesions. Or bruising Lymph: Cervical, supraclavicular normal. Neurologic: Grossly non-focal Pertinent Labs none ? Impression/Recommendation Chronic maxillary sinusitis secondary to MSSA in the cultures taken November 2022, January 2023 and March 2023. She has completed 6 weeks of IV cefazolin and is right currently on p.o. Keflex.  She will continue this until she sees  the ENT surgeon.  she has low IgA levels.  Is around 76.  Other immunoglobulin levels were normal.  Referred to Dr. Randel Pigg at [facility] allergy immunology.  She has referred her to another ENT. ANCA, ESR, CRP and HIV were all normal.   DM- on ozempic and metformin  T12 fracture on Tymlos injection Discussed the management with the patient in detail.  We will follow her as needed.  Note:  This document was prepared using Dragon voice recognition software and may include unintentional dictation errors.  PS Labs were done today.  CRP 0.6 WBC 4.8 Hb 12.2 Sed rate 11 Glucose 126 Creatinine LFTs normal

## 2021-07-02 NOTE — Patient Instructions (Addendum)
You are here for follow up of sinusitis with MSSA- you are on keflex- you can reduce it to 1 tab q 6 till your appt with ENT at [facility]. Follow up PRN-will do labs today

## 2021-07-08 ENCOUNTER — Encounter: Payer: Self-pay | Admitting: Infectious Diseases

## 2021-07-20 ENCOUNTER — Other Ambulatory Visit: Payer: Self-pay | Admitting: Infectious Diseases

## 2021-08-05 ENCOUNTER — Encounter: Payer: Self-pay | Admitting: Infectious Diseases

## 2021-08-06 ENCOUNTER — Ambulatory Visit: Payer: BC Managed Care – PPO | Attending: Infectious Diseases | Admitting: Infectious Diseases

## 2021-08-06 DIAGNOSIS — J328 Other chronic sinusitis: Secondary | ICD-10-CM | POA: Diagnosis present

## 2021-08-06 DIAGNOSIS — D802 Selective deficiency of immunoglobulin A [IgA]: Secondary | ICD-10-CM | POA: Insufficient documentation

## 2021-08-06 DIAGNOSIS — M869 Osteomyelitis, unspecified: Secondary | ICD-10-CM | POA: Insufficient documentation

## 2021-08-06 DIAGNOSIS — M8618 Other acute osteomyelitis, other site: Secondary | ICD-10-CM

## 2021-08-06 DIAGNOSIS — J320 Chronic maxillary sinusitis: Secondary | ICD-10-CM

## 2021-08-06 DIAGNOSIS — A4901 Methicillin susceptible Staphylococcus aureus infection, unspecified site: Secondary | ICD-10-CM | POA: Insufficient documentation

## 2021-08-06 MED ORDER — AMOXICILLIN-POT CLAVULANATE ER 1000-62.5 MG PO TB12: 2.0000 | ORAL_TABLET | Freq: Two times a day (BID) | ORAL | 1 refills | Status: DC

## 2021-08-06 NOTE — Progress Notes (Signed)
The purpose of this virtual visit is to provide medical care while limiting exposure to the novel coronavirus (COVID19) for both patient and office staff.   Consent was obtained for phone visit:  Yes.   Answered questions that patient had about telehealth interaction:  Yes.   I discussed the limitations, risks, security and privacy concerns of performing an evaluation and management service by telephone. I also discussed with the patient that there may be a patient responsible charge related to this service. The patient expressed understanding and agreed to proceed.   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 She checked vaccine response and did not think IVIG would help  She referred her for a 2nd ENT opinion Saw ENT Dr.Senior  at [facility] on 7/10 and was sent for Ct brain /sinus and has a follow up appt 1st week of august to discuss options .  Says diclox 1 gram Po Q 6 is killing her stomach C/o pain sinus, having thick secretion stuff from the nose  Discussed with her that I will discontinue Dicloxacillin and change to Augmentin ER 1 gram PO BID - will give a prescription for 15 days and see whether she can tolerate the antibiotic Also will  ask her Immunologist whether IVIG will help Total time spent 10 min

## 2021-08-17 ENCOUNTER — Encounter: Payer: Self-pay | Admitting: Infectious Diseases

## 2021-08-27 ENCOUNTER — Telehealth: Payer: Self-pay | Admitting: Infectious Diseases

## 2021-08-27 ENCOUNTER — Other Ambulatory Visit: Payer: Self-pay | Admitting: Infectious Diseases

## 2021-08-27 MED ORDER — AMOXICILLIN-POT CLAVULANATE 875-125 MG PO TABS: 1.0000 | ORAL_TABLET | Freq: Two times a day (BID) | ORAL | 0 refills | Status: AC

## 2021-08-27 NOTE — Progress Notes (Signed)
Sent prescription for augmentin 875/125 X 30 days for chronic osteo/sinusitis DC 1000mg 

## 2021-08-27 NOTE — Telephone Encounter (Signed)
Spoke to patient, and told her no role for IVIG after I spoke to immunologist at [facility]- Today she saw the ENT surgeon there and they are planning for surgery. So will continue amox/clav

## 2023-08-18 IMAGING — CT CT MAXILLOFACIAL W/O CM
3 of 4 series · 14 of 47 positions shown, 16 images · non-contrast
Comparison: None.

CLINICAL DATA: Chronic sinusitis



[Series 2: sinus 1.0 hr38 3 · axial · 0.39mm/px · z∈[-118,+50]mm · 8 of 197 slices shown, 10 images]
[im 15/197  brain]
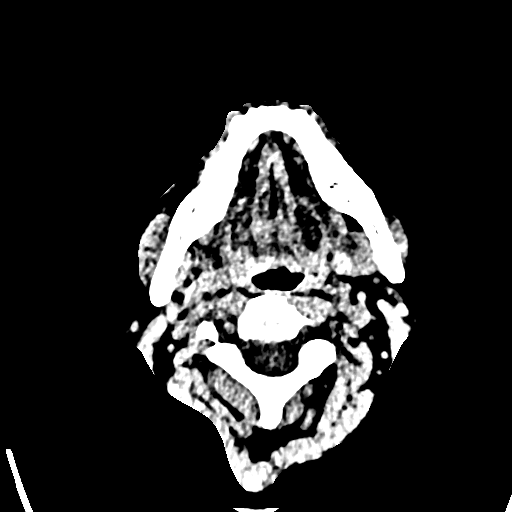
[im 15/197  bone]
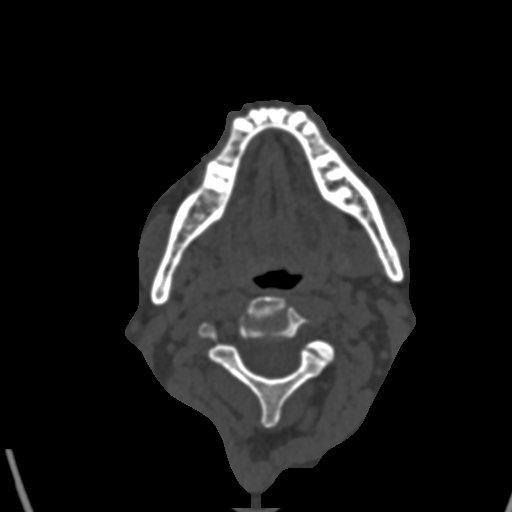
[im 43/197  bone]
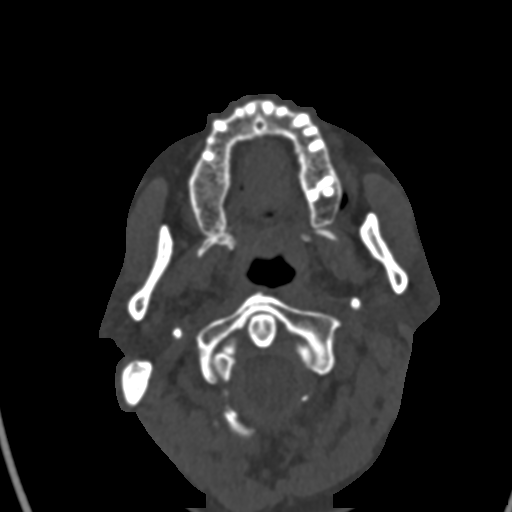
[im 71/197  bone]
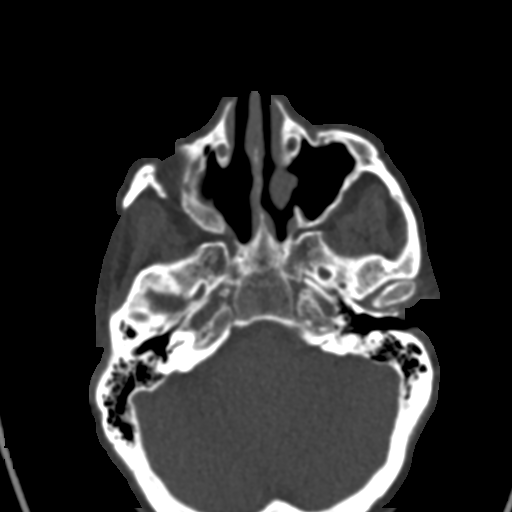
[im 85/197  bone]
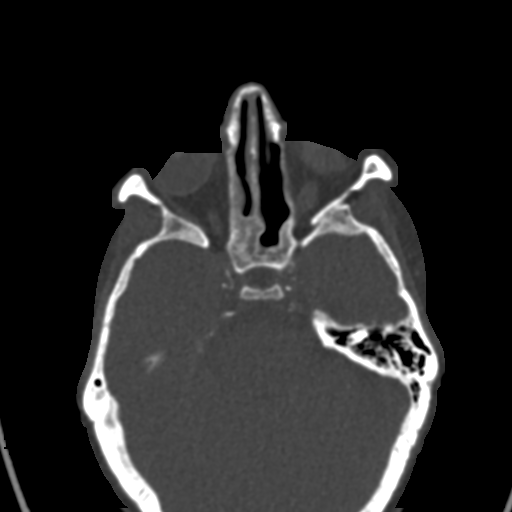
[im 113/197  brain]
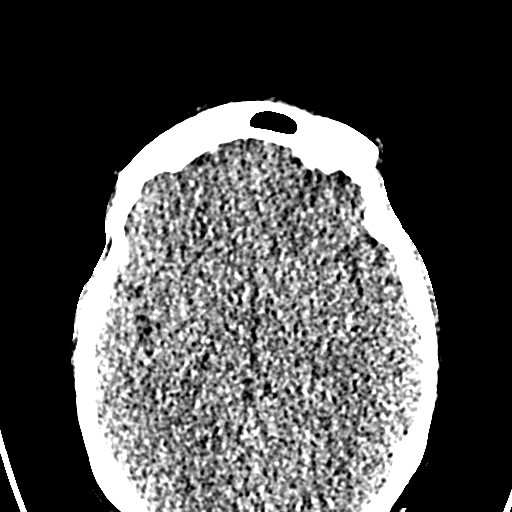
[im 113/197  bone]
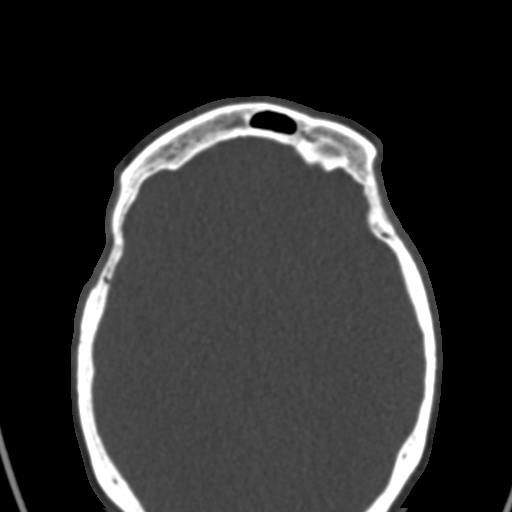
[im 127/197  bone]
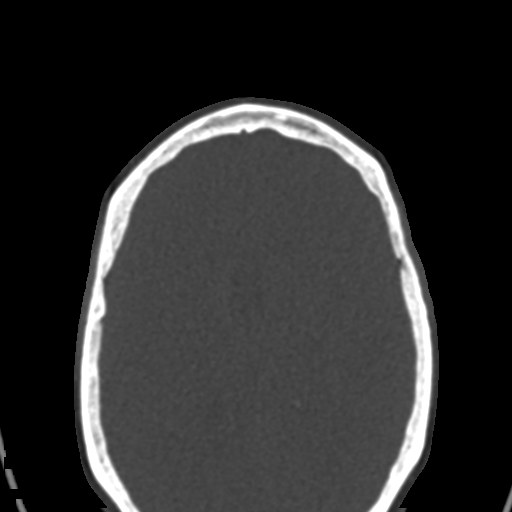
[im 155/197  bone]
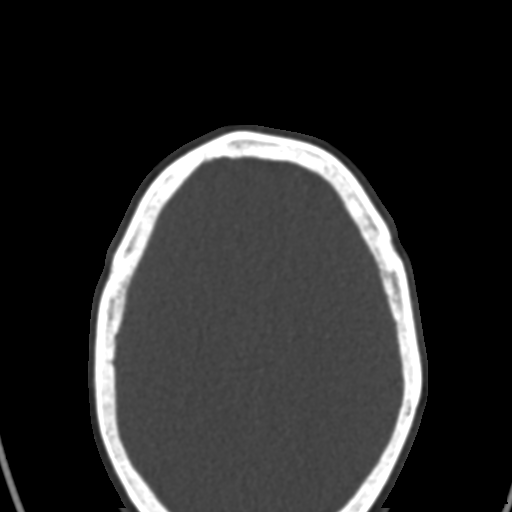
[im 183/197  bone]
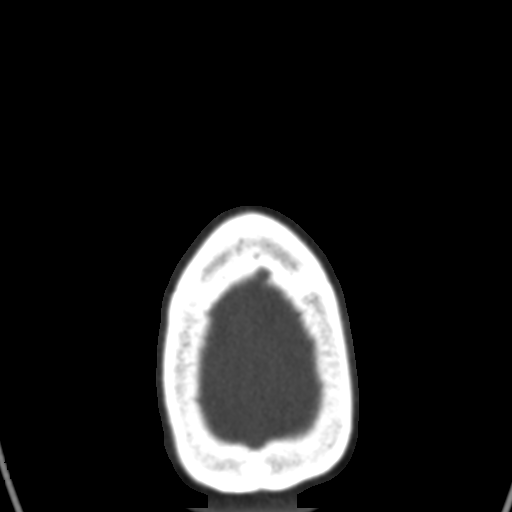

[Series 4: coronal · coronal · 0.34mm/px · 3 of 85 slices shown]
[im 29/85  bone]
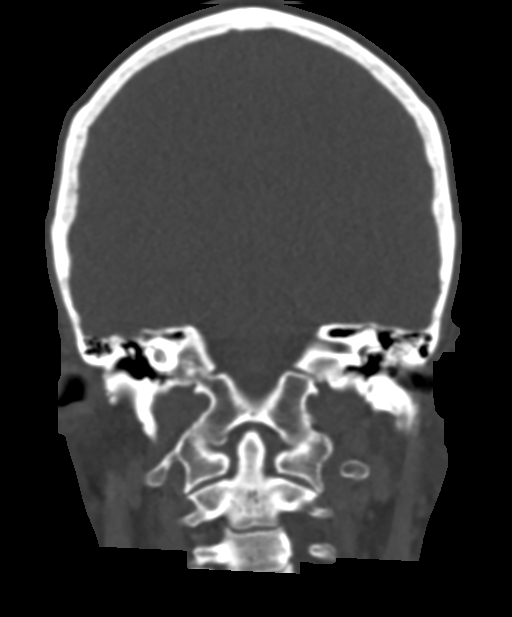
[im 38/85  bone]
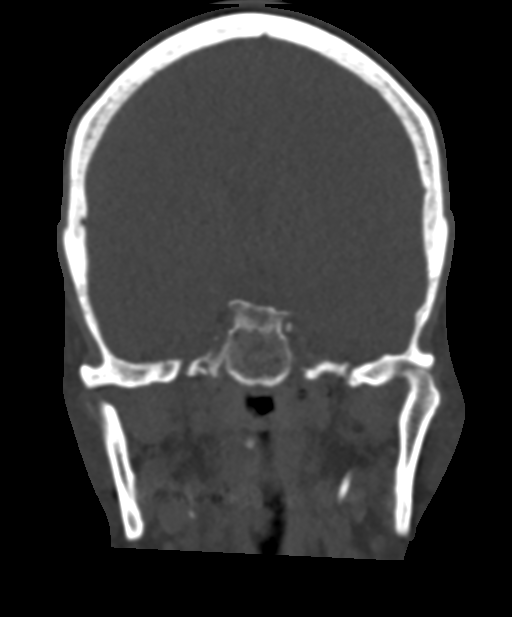
[im 47/85  bone]
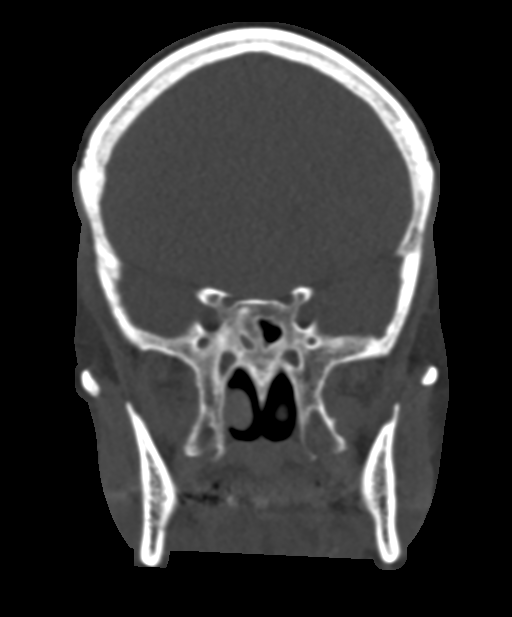

[Series 5: sagittal · sagittal · 0.33mm/px · 3 of 84 slices shown]
[im 28/84  bone]
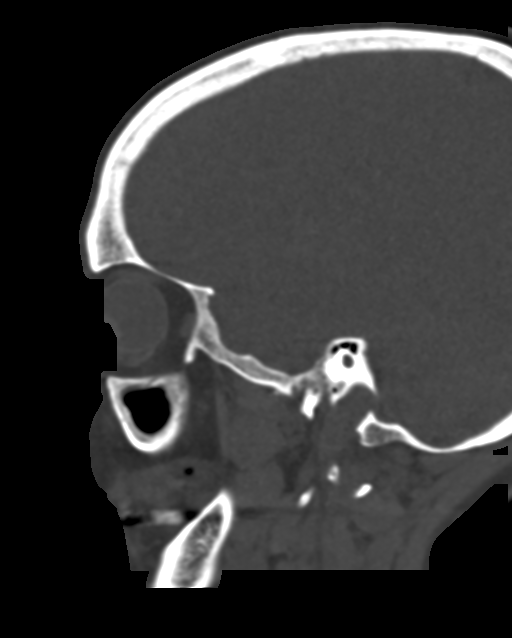
[im 42/84  bone]
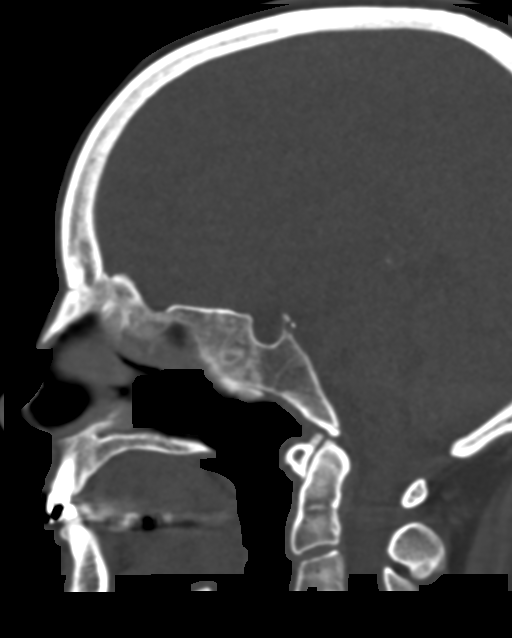
[im 56/84  bone]
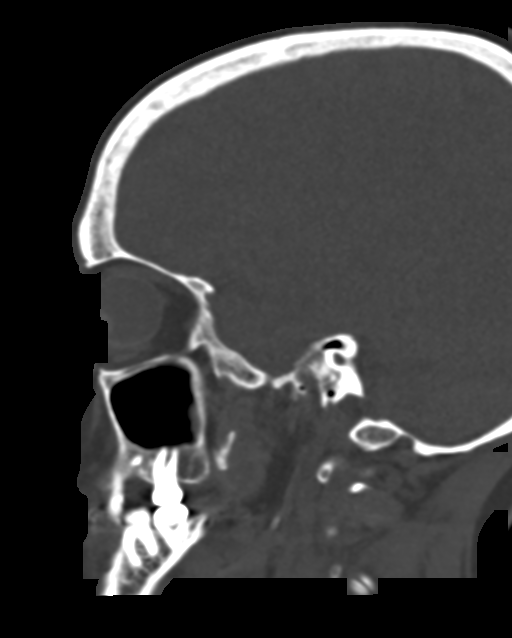

[14 of 47 positions shown; findings below may reference images not displayed]

FINDINGS: Paranasal sinuses:

Evidence of extensive prior sinonasal surgery including maxillary
antrostomies, ethmoidectomies, bilateral middle turbinate resection,
sphenoidotomies.

Frontal: Undeveloped right sinus. Left sinus is clear. Drainage
pathways are patent.

Ethmoid: Ethmoidectomies. Wall thickening with sclerosis. Mild
patchy opacification.

Maxillary: Mild mucosal thickening. Wall thickening with sclerosis,
right greater than left.

Sphenoid: Poorly developed on the right with opacification and
occluded drainage. Partially opacified on the left with patent
drainage. Wall thickening with sclerosis. Patent sphenoethmoidal
recesses.

Right ostiomeatal unit: Patent.

Left ostiomeatal unit: Patent.

Nasal passages: Patent. Intact nasal septum is midline.

Anatomy: Cribriform plate and fovea ethmoidalis are intact. Conchal
sphenoid pneumatization pattern. No dehiscence of carotid or optic
canals. No onodi cell.

Other: Limited intracranial imaging demonstrates no acute
abnormality. Mastoid air cells are clear. Temporomandibular joints
are unremarkable.
IMPRESSION: Extensive prior sinonasal surgery. Mild inflammatory changes are
present. Wall thickening and sclerosis throughout reflecting prior
inflammation.
